# Patient Record
Sex: Male | Born: 2000 | Race: White | Hispanic: No | Marital: Single
Health system: Southern US, Community
[De-identification: ages and names within clinical notes are randomized; demographics above are authoritative.]

## PROBLEM LIST (undated history)

## (undated) DIAGNOSIS — T148XXA Other injury of unspecified body region, initial encounter: Secondary | ICD-10-CM

---
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
## 2019-03-27 ENCOUNTER — Other Ambulatory Visit: Payer: Self-pay

## 2019-03-27 ENCOUNTER — Encounter: Payer: Self-pay | Admitting: Podiatry

## 2019-03-27 ENCOUNTER — Ambulatory Visit (INDEPENDENT_AMBULATORY_CARE_PROVIDER_SITE_OTHER): Payer: 59 | Admitting: Podiatry

## 2019-03-27 VITALS — BP 110/81 | HR 79 | Temp 97.7°F | Resp 16

## 2019-03-27 DIAGNOSIS — L600 Ingrowing nail: Secondary | ICD-10-CM

## 2019-03-27 MED ORDER — DOXYCYCLINE HYCLATE 100 MG PO TABS: 100.0000 mg | ORAL_TABLET | Freq: Two times a day (BID) | ORAL | 0 refills | Status: DC

## 2019-03-27 MED ORDER — NEOMYCIN-POLYMYXIN-HC 1 % OT SOLN: OTIC | 1 refills | Status: AC

## 2019-03-27 NOTE — Patient Instructions (Signed)

## 2019-03-27 NOTE — Progress Notes (Signed)
  Subjective:  Patient ID: Xavier Mora, male    DOB: 03-13-2001,  MRN: [id number] HPI Chief Complaint  Patient presents with  . Toe Pain    Hallux left - lateral border, red, swollen, draining x 2 months, no treatment  . New Patient (Initial Visit)    18 y.o. male presents with the above complaint.   ROS: Denies fever chills nausea vomiting muscle aches pains calf pain back pain chest pain shortness of breath.  Past Medical History:  Diagnosis Date  . Fracture    rt wrist fracture x 2 and rt leg fracture x 1   No past surgical history on file.  Current Outpatient Medications:  .  CLARAVIS 40 MG capsule, , Disp: , Rfl:  .  doxycycline (VIBRA-TABS) 100 MG tablet, Take 1 tablet (100 mg total) by mouth 2 (two) times daily., Disp: 20 tablet, Rfl: 0 .  NEOMYCIN-POLYMYXIN-HYDROCORTISONE (CORTISPORIN) 1 % SOLN OTIC solution, Apply 1-2 drops to toe BID after soaking, Disp: 10 mL, Rfl: 1  No Known Allergies Review of Systems Objective:   Vitals:   03/27/19 1510  BP: 110/81  Pulse: 79  Resp: 16  Temp: 97.7 F (36.5 C)    General: Well developed, nourished, in no acute distress, alert and oriented x3   Dermatological: Skin is warm, dry and supple bilateral. Nails x 10 are well maintained; remaining integument appears unremarkable at this time. There are no open sores, no preulcerative lesions, no rash or signs of infection present.  Vascular: Dorsalis Pedis artery and Posterior Tibial artery pedal pulses are 2/4 bilateral with immedate capillary fill time. Pedal hair growth present. No varicosities and no lower extremity edema present bilateral.   Neruologic: Grossly intact via light touch bilateral. Vibratory intact via tuning fork bilateral. Protective threshold with Semmes Wienstein monofilament intact to all pedal sites bilateral. Patellar and Achilles deep tendon reflexes 2+ bilateral. No Babinski or clonus noted bilateral.   Musculoskeletal: No gross boney pedal  deformities bilateral. No pain, crepitus, or limitation noted with foot and ankle range of motion bilateral. Muscular strength 5/5 in all groups tested bilateral.  Gait: Unassisted, Nonantalgic.    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  Plan: After local anesthetic was administered chemical matrixectomy was performed with resection of the abscess.  He tolerated procedure well without complications.  Was given both oral and home-going instructions for the care and soaking of the toe as well as a prescription for Cortisporin Otic to be applied twice daily after soaking.  He also received a prescription for doxycycline and I will follow-up with him in 2 weeks.     Xavier Mora T. [place], [place]

## 2019-04-10 ENCOUNTER — Ambulatory Visit (INDEPENDENT_AMBULATORY_CARE_PROVIDER_SITE_OTHER): Payer: 59 | Admitting: Podiatry

## 2019-04-10 ENCOUNTER — Other Ambulatory Visit: Payer: Self-pay

## 2019-04-10 ENCOUNTER — Encounter: Payer: Self-pay | Admitting: Podiatry

## 2019-04-10 VITALS — Temp 97.3°F

## 2019-04-10 DIAGNOSIS — L600 Ingrowing nail: Secondary | ICD-10-CM

## 2019-04-10 DIAGNOSIS — Z9889 Other specified postprocedural states: Secondary | ICD-10-CM

## 2019-04-10 NOTE — Patient Instructions (Signed)

## 2019-04-10 NOTE — Progress Notes (Signed)
He presents today for follow-up of his nail procedure left.  States that he continues to take his doxycycline.  States that is fine he still soaking still taking his medicine.  Objective: Vital signs are stable he is alert and oriented x3.  Pulses are palpable.  No erythema edema cellulitis drainage or odor.  Assessment: Well-healing fibular fibular matrixectomy hallux left.  Plan: I recommended he continue to soak until completely resolved.  He will notify me with any questions or concerns or signs of infection.

## 2019-08-07 IMAGING — CR DG FINGER LITTLE 2+V*L*
3 series · 3 of 3 positions shown · non-contrast
Comparison: 12/10/2017, 12/11/2017

CLINICAL DATA: Pinky injury during basketball

EXAM:
LEFT LITTLE FINGER 2+V

[finger ap]
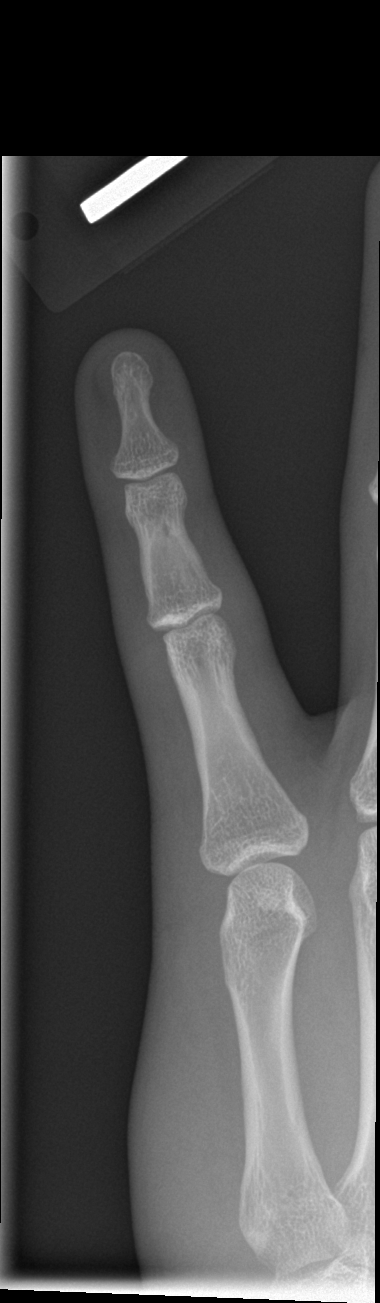

[finger obl]
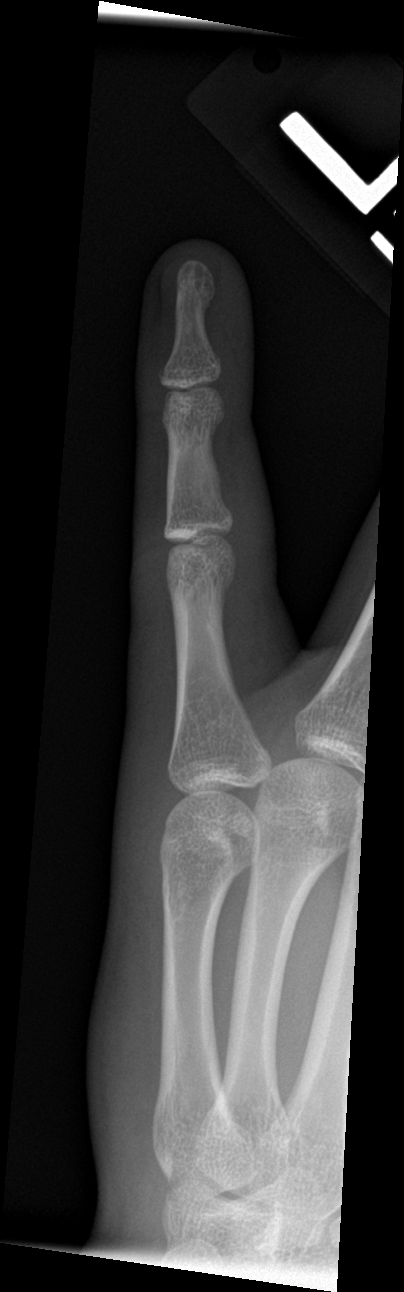

[finger lat]
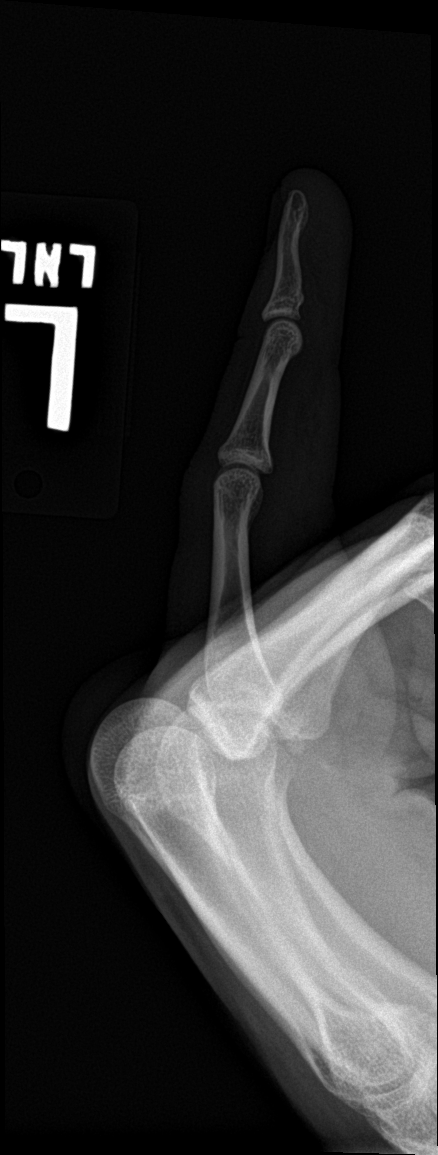

[3 of 3 positions shown; findings below may reference images not displayed]

FINDINGS: Reduction of previously noted dislocation at the fifth PIP joint. No
visible fracture.
IMPRESSION: Reduction of fifth PIP joint dislocation.

## 2019-08-07 IMAGING — DX DG FINGER LITTLE 2+V*L*
3 series · 3 of 3 positions shown · non-contrast
Comparison: Left fifth finger radiographs performed 12/10/2017

CLINICAL DATA: Status post attempted reduction of left fifth finger
dislocation.

EXAM:
LEFT LITTLE FINGER 2+V

[finger ap]
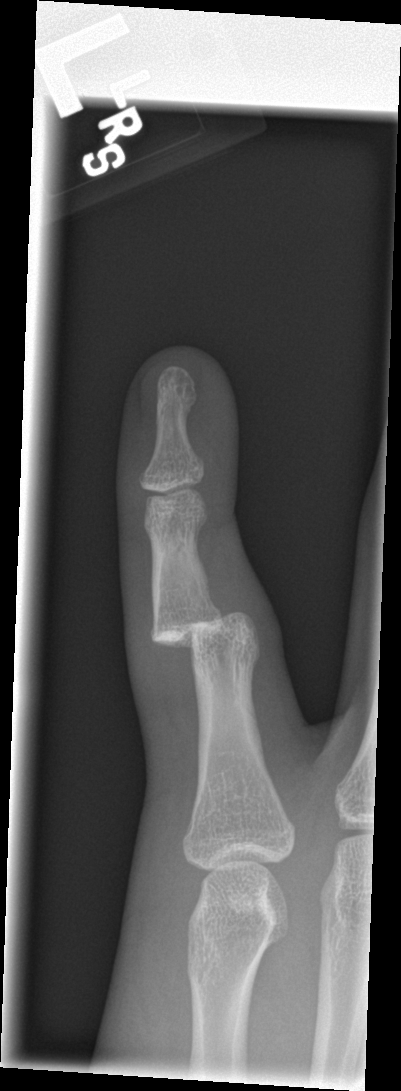

[finger obl]
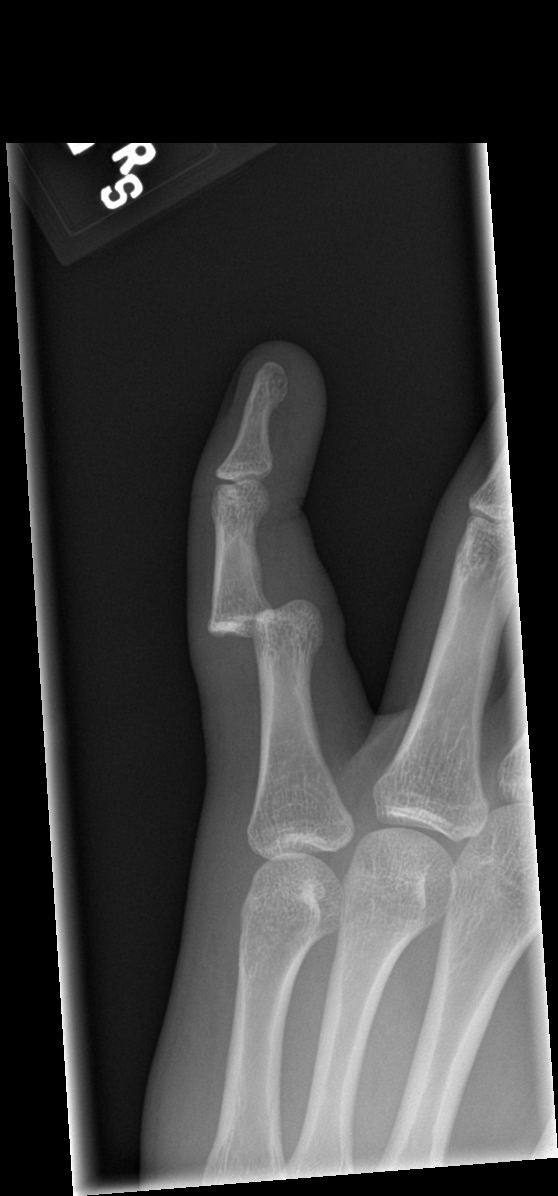

[finger lat]
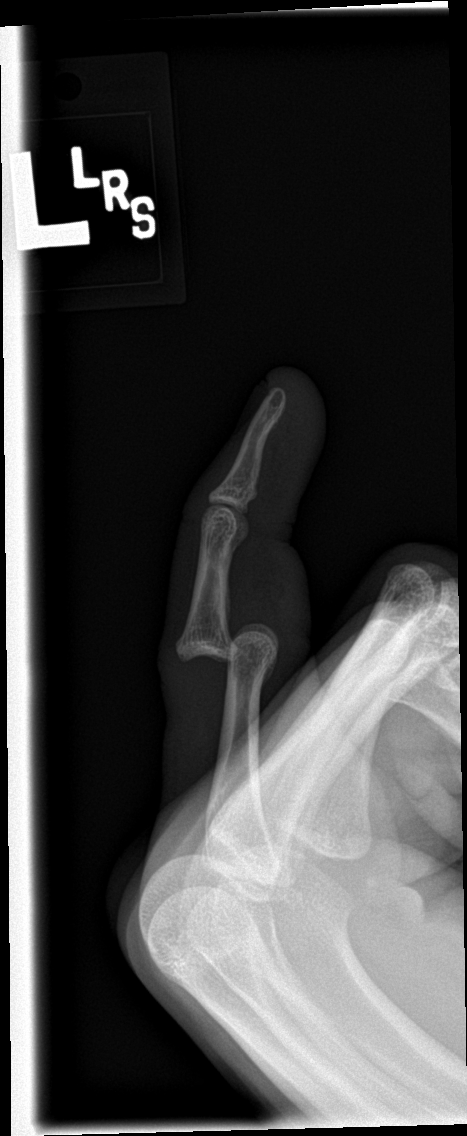

[3 of 3 positions shown; findings below may reference images not displayed]

FINDINGS: There is persistent dorsal and ulnar dislocation of the fifth
proximal interphalangeal joint. No definite fracture is
characterized at this time. Surrounding soft tissue swelling is
noted.
IMPRESSION: Persistent dorsal and ulnar dislocation of the fifth proximal
interphalangeal joint.
